# Patient Record
Sex: Female | Born: 1935 | Race: White | Hispanic: No
Health system: Southern US, Community
[De-identification: ages and names within clinical notes are randomized; demographics above are authoritative.]

## PROBLEM LIST (undated history)

## (undated) DIAGNOSIS — F0390 Unspecified dementia without behavioral disturbance: Secondary | ICD-10-CM

## (undated) DIAGNOSIS — I4891 Unspecified atrial fibrillation: Secondary | ICD-10-CM

---

## 2004-06-24 ENCOUNTER — Ambulatory Visit: Payer: Self-pay | Admitting: Internal Medicine

## 2004-12-22 ENCOUNTER — Ambulatory Visit: Payer: Self-pay | Admitting: Internal Medicine

## 2004-12-28 ENCOUNTER — Ambulatory Visit: Payer: Self-pay | Admitting: Internal Medicine

## 2005-12-25 ENCOUNTER — Ambulatory Visit: Payer: Self-pay | Admitting: Internal Medicine

## 2007-05-30 ENCOUNTER — Ambulatory Visit: Payer: Self-pay | Admitting: Family Medicine

## 2008-06-01 ENCOUNTER — Ambulatory Visit: Payer: Self-pay | Admitting: Internal Medicine

## 2010-07-26 ENCOUNTER — Ambulatory Visit: Payer: Self-pay | Admitting: Internal Medicine

## 2010-11-24 ENCOUNTER — Ambulatory Visit: Payer: Self-pay | Admitting: Physical Medicine and Rehabilitation

## 2014-05-05 ENCOUNTER — Inpatient Hospital Stay: Payer: Self-pay | Admitting: Internal Medicine

## 2014-05-05 LAB — COMPREHENSIVE METABOLIC PANEL
ALBUMIN: 3.5 g/dL (ref 3.4–5.0)
ALT: 18 U/L
AST: 20 U/L (ref 15–37)
Alkaline Phosphatase: 87 U/L
Anion Gap: 5 — ABNORMAL LOW (ref 7–16)
BUN: 25 mg/dL — AB (ref 7–18)
Bilirubin,Total: 0.4 mg/dL (ref 0.2–1.0)
Calcium, Total: 9.1 mg/dL (ref 8.5–10.1)
Chloride: 109 mmol/L — ABNORMAL HIGH (ref 98–107)
Co2: 29 mmol/L (ref 21–32)
Creatinine: 1.26 mg/dL (ref 0.60–1.30)
EGFR (African American): 51 — ABNORMAL LOW
EGFR (Non-African Amer.): 42 — ABNORMAL LOW
GLUCOSE: 167 mg/dL — AB (ref 65–99)
OSMOLALITY: 293 (ref 275–301)
POTASSIUM: 4.5 mmol/L (ref 3.5–5.1)
SODIUM: 143 mmol/L (ref 136–145)
Total Protein: 6.7 g/dL (ref 6.4–8.2)

## 2014-05-05 LAB — CBC
HCT: 39.3 % (ref 35.0–47.0)
HGB: 12.9 g/dL (ref 12.0–16.0)
MCH: 32.1 pg (ref 26.0–34.0)
MCHC: 32.9 g/dL (ref 32.0–36.0)
MCV: 98 fL (ref 80–100)
PLATELETS: 180 10*3/uL (ref 150–440)
RBC: 4.02 10*6/uL (ref 3.80–5.20)
RDW: 14.7 % — ABNORMAL HIGH (ref 11.5–14.5)
WBC: 9.1 10*3/uL (ref 3.6–11.0)

## 2014-05-05 LAB — APTT: Activated PTT: 23.9 secs (ref 23.6–35.9)

## 2014-05-05 LAB — PROTIME-INR
INR: 1.1
PROTHROMBIN TIME: 14 s (ref 11.5–14.7)

## 2014-05-05 LAB — MAGNESIUM: Magnesium: 1.9 mg/dL

## 2014-05-06 LAB — BASIC METABOLIC PANEL
ANION GAP: 6 — AB (ref 7–16)
BUN: 23 mg/dL — AB (ref 7–18)
CALCIUM: 8.4 mg/dL — AB (ref 8.5–10.1)
Chloride: 110 mmol/L — ABNORMAL HIGH (ref 98–107)
Co2: 27 mmol/L (ref 21–32)
Creatinine: 1.14 mg/dL (ref 0.60–1.30)
EGFR (African American): 58 — ABNORMAL LOW
EGFR (Non-African Amer.): 48 — ABNORMAL LOW
GLUCOSE: 121 mg/dL — AB (ref 65–99)
Osmolality: 290 (ref 275–301)
Potassium: 4.3 mmol/L (ref 3.5–5.1)
SODIUM: 143 mmol/L (ref 136–145)

## 2014-05-06 LAB — CBC WITH DIFFERENTIAL/PLATELET
BASOS ABS: 0.1 10*3/uL (ref 0.0–0.1)
BASOS PCT: 0.6 %
EOS ABS: 0 10*3/uL (ref 0.0–0.7)
Eosinophil %: 0.1 %
HCT: 30.7 % — AB (ref 35.0–47.0)
HGB: 10.1 g/dL — ABNORMAL LOW (ref 12.0–16.0)
LYMPHS ABS: 1.1 10*3/uL (ref 1.0–3.6)
Lymphocyte %: 11.1 %
MCH: 32.1 pg (ref 26.0–34.0)
MCHC: 33 g/dL (ref 32.0–36.0)
MCV: 97 fL (ref 80–100)
Monocyte #: 0.9 x10 3/mm (ref 0.2–0.9)
Monocyte %: 8.8 %
NEUTROS PCT: 79.4 %
Neutrophil #: 7.8 10*3/uL — ABNORMAL HIGH (ref 1.4–6.5)
Platelet: 151 10*3/uL (ref 150–440)
RBC: 3.16 10*6/uL — ABNORMAL LOW (ref 3.80–5.20)
RDW: 14.1 % (ref 11.5–14.5)
WBC: 9.8 10*3/uL (ref 3.6–11.0)

## 2014-05-06 LAB — TSH: Thyroid Stimulating Horm: 3.11 u[IU]/mL

## 2014-05-06 LAB — URINALYSIS, COMPLETE
Bilirubin,UR: NEGATIVE
Glucose,UR: NEGATIVE mg/dL (ref 0–75)
NITRITE: NEGATIVE
PH: 5 (ref 4.5–8.0)
Protein: NEGATIVE
RBC,UR: 46 /HPF (ref 0–5)
Specific Gravity: 1.017 (ref 1.003–1.030)
Squamous Epithelial: <1
WBC UR: 21 /HPF (ref 0–5)

## 2014-05-06 LAB — HEMOGLOBIN A1C: Hemoglobin A1C: 5.4 % (ref 4.2–6.3)

## 2014-05-07 LAB — CBC WITH DIFFERENTIAL/PLATELET
BASOS PCT: 0.7 %
Basophil #: 0.1 10*3/uL (ref 0.0–0.1)
EOS ABS: 0 10*3/uL (ref 0.0–0.7)
Eosinophil %: 0.3 %
HCT: 25.1 % — ABNORMAL LOW (ref 35.0–47.0)
HGB: 8.3 g/dL — ABNORMAL LOW (ref 12.0–16.0)
Lymphocyte #: 1.1 10*3/uL (ref 1.0–3.6)
Lymphocyte %: 12.5 %
MCH: 32.3 pg (ref 26.0–34.0)
MCHC: 33 g/dL (ref 32.0–36.0)
MCV: 98 fL (ref 80–100)
MONO ABS: 1 x10 3/mm — AB (ref 0.2–0.9)
Monocyte %: 11.5 %
NEUTROS ABS: 6.3 10*3/uL (ref 1.4–6.5)
Neutrophil %: 75 %
Platelet: 122 10*3/uL — ABNORMAL LOW (ref 150–440)
RBC: 2.57 10*6/uL — AB (ref 3.80–5.20)
RDW: 14 % (ref 11.5–14.5)
WBC: 8.5 10*3/uL (ref 3.6–11.0)

## 2014-05-07 LAB — BASIC METABOLIC PANEL
ANION GAP: 5 — AB (ref 7–16)
BUN: 26 mg/dL — AB (ref 7–18)
CHLORIDE: 113 mmol/L — AB (ref 98–107)
CO2: 27 mmol/L (ref 21–32)
Calcium, Total: 8 mg/dL — ABNORMAL LOW (ref 8.5–10.1)
Creatinine: 1.19 mg/dL (ref 0.60–1.30)
EGFR (African American): 55 — ABNORMAL LOW
EGFR (Non-African Amer.): 45 — ABNORMAL LOW
Glucose: 118 mg/dL — ABNORMAL HIGH (ref 65–99)
Osmolality: 295 (ref 275–301)
POTASSIUM: 4.1 mmol/L (ref 3.5–5.1)
SODIUM: 145 mmol/L (ref 136–145)

## 2014-05-07 LAB — HEMOGLOBIN: HGB: 8.6 g/dL — ABNORMAL LOW (ref 12.0–16.0)

## 2014-05-08 LAB — HEMOGLOBIN
HGB: 7.3 g/dL — ABNORMAL LOW (ref 12.0–16.0)
HGB: 8.9 g/dL — ABNORMAL LOW (ref 12.0–16.0)

## 2014-05-09 LAB — HEMOGLOBIN: HGB: 8.9 g/dL — AB (ref 12.0–16.0)

## 2014-10-10 NOTE — Op Note (Signed)
PATIENT NAMElmer Sow:  Navarrette, Solash P MR#:  [id number] DATE OF BIRTH:  [birth year]  DATE OF PROCEDURE:  05/06/2014  PREOPERATIVE DIAGNOSIS: Unstable right intertrochanteric hip fracture.   POSTOPERATIVE DIAGNOSIS: Unstable right intertrochanteric hip fracture.  PROCEDURE: Open reduction and internal fixation, right intertrochanteric hip fracture.   SURGEON: Kennedy BuckerMichael Liron Eissler, M.D.   ANESTHESIA: Spinal.   DESCRIPTION OF PROCEDURE: The patient was brought to the operating room and after adequate spinal anesthesia was obtained, the patient was placed on the operative table with the left leg in the well leg holder, right foot in the foot traction boot with slight traction applied. C-arm was brought in and good visualization of the proximal femur was obtained with essentially anatomic alignment of the fracture. After prepping and draping in the usual sterile fashion, appropriate patient identification and timeout procedures were completed. A small incision was made proximally, and a guidewire was inserted into the tip of the trochanter with soft tissue protector placed around this and proximal reaming was carried out. A guidewire was inserted down the canal. Next, reaming was carried out to 11 mm, at which point there was chatter. Measurements were made of the wire in a 9 x 360 mm right Affixus nail was inserted down the canal. The guidewire was removed. Using the lateral targeting device on the 130 degrees guide, a small lateral incision was made and a guidewire was inserted up into the center of the femoral head. This was measured, drilled, and then a 100 mm lag screw was inserted with traction released and compression applied after setting the screw so it would not back out. The insertion handle was removed and permanent AP and lateral images were obtained. The distal interlocking screw was then placed after getting good visualization of the distal rod. A small stab incision was made, drilling, and placing a 42 mm  5.0 cortical screw. Permanent AP view was obtained showing the screw crossing the femur. At this point, the wounds were thoroughly irrigated. The deep fascia was closed using an 0 Vicryl, 2-0 Vicryl  subcutaneously, and skin staples. Xeroform, 4 x 4's, ABD, and tape applied. The patient was sent recovery room in stable condition.   ESTIMATED BLOOD LOSS: 200 mL.   COMPLICATIONS: None.   SPECIMEN: None.   IMPLANTS: Biomet right long Affixus 9 x 360 mm 130 degrees with 100 mm lag screw, 42 mm  distal interlocking screw.    ____________________________ Leitha SchullerMichael J. Jearldean Gutt, MD mjm:bm D: 05/06/2014 19:37:43 ET T: 05/06/2014 23:37:03 ET JOB#: [id number]  cc: Leitha SchullerMichael J. Emmert Roethler, MD, <Dictator> Leitha SchullerMICHAEL J Cerria Randhawa MD ELECTRONICALLY SIGNED 05/07/2014 7:23

## 2014-10-10 NOTE — H&P (Signed)
PATIENT NAMElmer Rojas:  Rojas, Brooke Rojas MR#:  [id number] DATE OF BIRTH:  [birth year]  DATE OF ADMISSION:  05/05/2014  PRIMARY CARE PHYSICIAN:  Dr. Harrington Challengerhies.   CHIEF COMPLAINT: Hip pain.   HISTORY OF PRESENT ILLNESS: This is a [age] year old female with dementia who was out in the yard today and had a fall. Someone passing by saw her in the yard and got help, nobody witnessed the fall. The patient is unable to give me any details about what happened with the fall. Pain is severe in the right hip. The patient just received IV pain medications ordered by the ER physician. The patient feels okay. No complaints of chest pain or shortness of breath. In the ER was found to have a right intertrochanteric femur fracture, also right superior and inferior pubic rami fractures indeterminate acuity. Hospitalist services were contacted for further evaluation. ER physician Dr. Scotty CourtStafford contacted Dr. Rosita KeaMenz from the Emergency Room.   PAST MEDICAL HISTORY: Alzheimer dementia.   PAST SURGICAL HISTORY: None.    ALLERGIES: No known drug allergies.   MEDICATIONS: Include alendronate 70 mg once a week on Fridays, donepezil which is Aricept 10 mg at bedtime, Namenda 10 mg twice a day, vitamin D3, 1000 international units daily.   SOCIAL HISTORY: Lives with family. No smoking. No alcohol. No drug use. Used to work in a [employer] and [employer].   FAMILY HISTORY: Father died with Parkinson's. Mother died of a stroke.   REVIEW OF SYSTEMS:  CONSTITUTIONAL: No fever, chills, or sweats. No weight loss. No weight gain. No weakness or fatigue.  EYES: She does wear glasses.  EARS, NOSE, MOUTH, AND THROAT:  No hearing loss. No sore throat. No difficulty swallowing.  CARDIOVASCULAR: No chest pain. No palpitations.  RESPIRATORY: No shortness of breath, no cough, no sputum. No hemoptysis.  GASTROINTESTINAL: No nausea. No vomiting. No abdominal pain. No diarrhea. No constipation. No bright red blood per rectum. No melena.   GENITOURINARY: No burning on urination. No hematuria.  MUSCULOSKELETAL: Positive for right hip pain.  INTEGUMENT: No rashes or eruptions.  NEUROLOGIC: No fainting or blackouts.  PSYCHIATRIC: On medication for depression.  ENDOCRINE: No thyroid problems.  HEMATOLOGIC AND LYMPHATIC: No anemia. No easy bruising or bleeding.   PHYSICAL EXAMINATION:  VITAL SIGNS: Temperature 98, pulse 86, respirations 20, blood pressure 156/91, pulse oximetry 98% on room air.  GENERAL: No respiratory distress, lying flat in bed.  EYES: Conjunctivae and lids normal. Pupils equal, round, and reactive to light. Extraocular muscles intact. No nystagmus.  EARS, NOSE, MOUTH, AND THROAT: Tympanic membranes, no erythema. Nasal mucosa, no erythema. Throat, no erythema, no exudate seen. Lips and gums, no lesions.  NECK: No JVD. No bruits. No lymphadenopathy. No thyromegaly. No thyroid nodules palpated.  RESPIRATORY:  Lungs clear to auscultation. No use of accessory muscles to breathe. No rhonchi, rales, or wheeze heard.  CARDIOVASCULAR: S1, S2, irregularly irregular, rate controlled, 4 out of 6 systolic ejection murmur. Dorsalis pedis pulses 2 + bilaterally. Trace edema. Carotid upstroke 2 + bilaterally  ABDOMEN: Soft, nontender. No organomegaly/splenomegaly. Normoactive bowel sounds. No masses felt.  LYMPHATIC: No lymph nodes in the neck.  MUSCULOSKELETAL: Right leg shortened and externally rotated. 1 + edema. No clubbing. No cyanosis.  SKIN: No ulcers or lesions seen.  NEUROLOGICAL: Cranial nerves II through XII grossly intact. Sensation intact to bilateral lower extremities to light touch.  PSYCHIATRIC: The patient is alert to person and place.   LABORATORY AND RADIOLOGICAL DATA: EKG shows atrial fibrillation,  88 beats per minute. Chest x-ray negative. Right hip shows right superior and inferior pubic rami fractures indeterminate acuity, right intertrochanteric femur fracture. CT scan of the head and cervical spine  shows a superior endplate compression fracture of T2 vertebral body without acute fracture planes which favor old fracture, brain shows atrophy, small vessel chronic ischemic changes deep cerebral white matter. White blood cell count 9.1, H and H 12.9 and 39.3, platelet count 180,000. Glucose 167, BUN 25, creatinine 1.26, sodium 143, potassium 4.5, chloride 109, CO2 of 29, calcium 9.1. Liver function tests normal range. PT, INR normal range.   ASSESSMENT AND PLAN:  1.  Hip fracture on the right, closed, preoperative evaluation. I see no contraindications to surgery at this time. No further cardiac testing needed prior to surgery. The patient does have a high risk of mortality within the first year of a hip fracture, especially if surgery not done with risks of blood clots, skin breakdown, and pneumonia if you are just lying around in bed.  Surgery must be done in order to try to walk again, Family and patient willing to undergo the risks of surgery in order to walk again as quickly as possible.  2.  Atrial fibrillation, unclear if new or old. Again you do not have to get cardiac testing prior to surgery, but I will monitor off unit telemetry, echocardiogram, and start low-dose metoprolol, anticoagulation consideration after surgery, likely with dementia aspirin would be the likely choice.  3.  Dementia without behavioral disturbance, patient on Aricept and Namenda.  4.  Renal insufficiency, unclear if this is acute renal failure versus chronic kidney disease stage III. We will give gentle fluids and monitor creatinine again in the morning.  5.  Impaired fasting glucose. Check a hemoglobin A1c, put on sliding scale at this point.    TIME SPENT ON ADMISSION: 55 minutes.   CODE STATUS:  The patient is a DNR.    ____________________________ Herschell Dimes. Renae Gloss, MD rjw:bu D: 05/05/2014 20:40:38 ET T: 05/05/2014 21:12:17 ET JOB#: [id number]  cc: Herschell Dimes. Renae Gloss, MD, <Dictator> Neomia Dear. Harrington Challenger,  MD Salley Scarlet MD ELECTRONICALLY SIGNED 05/08/2014 16:31

## 2014-10-10 NOTE — Discharge Summary (Signed)
PATIENT NAME:  Brooke Rojas, [name] MR#:  [id number] DATE OF BIRTH:  05/29/1922  DATE OF ADMISSION:  05/05/2014 DATE OF DISCHARGE:  05/09/2014  ADDENDUM:   ADMITTING PHYSICIAN: Alford Highlandichard Wieting, DO.  DISCHARGING PHYSICIAN: Enid Baasadhika Sampson Self, MD.   PRIMARY CARE PHYSICIAN: Neomia Dearavid N. Harrington Challengerhies, MD.  CONSULTATIONS IN THE HOSPITAL: Orthopedic consultation by Dr. Kennedy BuckerMichael Menz.   For whole discharge summary, discharge medications and hospital course, please look at the discharge summary dictated by Dr. Enedina FinnerSona Patel on 05/09/2014. In brief, the patient is a 81 year old elderly female with a history of dementia, admitted after a fall and had sustained a right hip fracture. She had open reduction and internal fixation done by Dr. Rosita KeaMenz in the hospital on 05/06/2014. Postoperatively, the patient has done very well. Worked with physical therapy. Pain better controlled and is being discharged [facility]. Her course has been otherwise uneventful.   DISCHARGE CONDITION: Stable.   DISCHARGE DISPOSITION: To [facility].   CODE STATUS: Do not resuscitate.   TIME SPENT ON DISCHARGE: 40 minutes.    ____________________________ Enid Baasadhika Damarien Nyman, MD rk:TT D: 05/09/2014 13:54:47 ET T: 05/09/2014 19:47:53 ET JOB#: [id number]  cc: Enid Baasadhika Evart Mcdonnell, MD, <Dictator> Neomia Dearavid N. Harrington Challengerhies, MD Enid BaasADHIKA Torin Whisner MD ELECTRONICALLY SIGNED 06/03/2014 14:47

## 2014-10-10 NOTE — Discharge Summary (Signed)
PATIENT NAMElmer Rojas:  Brooke Rojas, Brooke Rojas MR#:  [id number] DATE OF BIRTH:  [birth year]  DATE OF ADMISSION:  05/05/2014 DATE OF DISCHARGE:  05/09/2014  PRESENTING COMPLAINT: Fall.   DISCHARGE DIAGNOSES: 1.  Right hip fracture status post mechanical fall.  2.  Dementia.  3.  Postoperative anemia status post blood transfusion.   PROCEDURES: Right femur intertrochanteric fracture status post nailing/pinning by Dr. Rosita KeaMenz.   CODE STATUS: No code, DNR.   DISCHARGE MEDICATIONS: 1.  Vitamin D3 1,000 international units Rojas.o. daily.  2.  Alendronate 70 mg Rojas.o. q. weekly.  3.  Donepezil 10 mg at bedtime.  4.  Memantine 10 mg b.i.d.  5.  Lovenox 40 mg subcutaneous daily for 14 days and then begin aspirin 81 mg daily.  6.  Acetaminophen/hydrocodone 325/ one tablet every 6 hours as needed.   DISCHARGE INSTRUCTIONS: 1.  Physical therapy.  2.  Follow up with Dr. Harrington Challengerhies, primary care physician, in 2 to 4 weeks.  3.  Follow up with Dr. Rosita KeaMenz per instructions.  DIAGNOSTIC DATA: White count 8.5, H and H 8.3 and 25.1. Creatinine 1.19, sodium 145, potassium 4.1, chloride 113.  Right hip: Internal fixation of intertrochanteric fracture of the proximal right femur.  Echo Doppler of the heart: EF 70% to 75%, moderately dilated left atrium. Moderately dilated right atrium. Moderate to severe mitral valve regurgitation. Mild aortic regurgitation. Moderate to severe tricuspid regurgitation.   White count is 9.8. Hemoglobin A1c is 5.4.   BRIEF SUMMARY OF HOSPITAL COURSE: [name] is a 81 year old Caucasian female with past medical history of dementia, comes in with:  1.  Hip fracture. The patient had a mechanical fall. She is postop day 2, underwent surgery by Dr. Rosita KeaMenz. Postop she is doing well, tolerating physical therapy well. She will be discharged to rehab in a.m. 2.  Arrhythmia, tachycardia versus atrial fibrillation. Unclear if new or old. The patient's heart rate is stable. Will continue aspirin, as far as  anticoagulation. Given her dementia and fall would be a good choice.  3.  Dementia without behavioral disturbance. The patient on Aricept and Namenda.  4.  Renal insufficiency. The patient received IV fluids, stable.  5.  Postop anemia, status post 1 unit of blood transfusion.  If remains stable will [facility]. The patient is a no code, DNR.   TIME SPENT: 40 minutes.   ____________________________ Wylie HailSona A. Allena KatzPatel, MD sap:sb D: 05/08/2014 14:07:00 ET T: 05/08/2014 16:28:46 ET JOB#: [id number]  cc: Thanh Mottern A. Allena KatzPatel, MD, <Dictator> Neomia Dearavid N. Harrington Challengerhies, MD Leitha SchullerMichael J. Menz, MD Willow OraSONA A Mekiah Wahler MD ELECTRONICALLY SIGNED 05/21/2014 14:07

## 2014-10-10 NOTE — Consult Note (Signed)
Brief Consult Note: Diagnosis: right intertrochanteric hip fracture.   Patient was seen by consultant.   Orders entered.   Comments: ORIF right hip with IM rod when OR schedules allows.  Electronic Signatures: Leitha SchullerMenz, Dayzee Trower J (MD)  (Signed [id number]-Nov-15 23:12)  Authored: Brief Consult Note   Last Updated: 17-Nov-15 23:12 by Leitha SchullerMenz, Navy Rothschild J (MD)

## 2016-03-18 ENCOUNTER — Emergency Department

## 2016-03-18 ENCOUNTER — Emergency Department
Admission: EM | Admit: 2016-03-18 | Discharge: 2016-03-18 | Disposition: A | Discharge status: Home / Self Care | Attending: Student | Admitting: Student

## 2016-03-18 DIAGNOSIS — S40211A Abrasion of right shoulder, initial encounter: Secondary | ICD-10-CM | POA: Diagnosis not present

## 2016-03-18 DIAGNOSIS — Y999 Unspecified external cause status: Secondary | ICD-10-CM | POA: Insufficient documentation

## 2016-03-18 DIAGNOSIS — W19XXXA Unspecified fall, initial encounter: Secondary | ICD-10-CM | POA: Insufficient documentation

## 2016-03-18 DIAGNOSIS — S06300A Unspecified focal traumatic brain injury without loss of consciousness, initial encounter: Secondary | ICD-10-CM | POA: Insufficient documentation

## 2016-03-18 DIAGNOSIS — Y939 Activity, unspecified: Secondary | ICD-10-CM | POA: Insufficient documentation

## 2016-03-18 DIAGNOSIS — S0990XA Unspecified injury of head, initial encounter: Secondary | ICD-10-CM | POA: Diagnosis present

## 2016-03-18 DIAGNOSIS — I629 Nontraumatic intracranial hemorrhage, unspecified: Secondary | ICD-10-CM

## 2016-03-18 DIAGNOSIS — Y929 Unspecified place or not applicable: Secondary | ICD-10-CM | POA: Diagnosis not present

## 2016-03-18 HISTORY — DX: Unspecified atrial fibrillation: I48.91

## 2016-03-18 HISTORY — DX: Unspecified dementia, unspecified severity, without behavioral disturbance, psychotic disturbance, mood disturbance, and anxiety: F03.90

## 2016-03-18 LAB — URINALYSIS COMPLETE WITH MICROSCOPIC (ARMC ONLY)
BILIRUBIN URINE: NEGATIVE
Glucose, UA: NEGATIVE mg/dL
Leukocytes, UA: NEGATIVE
NITRITE: NEGATIVE
PH: 5 (ref 5.0–8.0)
PROTEIN: 30 mg/dL — AB
Specific Gravity, Urine: 1.024 (ref 1.005–1.030)

## 2016-03-18 LAB — CBC WITH DIFFERENTIAL/PLATELET
Basophils Absolute: 0 10*3/uL (ref 0–0.1)
Basophils Relative: 0 %
EOS ABS: 0 10*3/uL (ref 0–0.7)
Eosinophils Relative: 0 %
HEMATOCRIT: 37.1 % (ref 35.0–47.0)
HEMOGLOBIN: 12.6 g/dL (ref 12.0–16.0)
LYMPHS ABS: 0.8 10*3/uL — AB (ref 1.0–3.6)
LYMPHS PCT: 7 %
MCH: 31.8 pg (ref 26.0–34.0)
MCHC: 34 g/dL (ref 32.0–36.0)
MCV: 93.6 fL (ref 80.0–100.0)
MONOS PCT: 5 %
Monocytes Absolute: 0.6 10*3/uL (ref 0.2–0.9)
NEUTROS PCT: 88 %
Neutro Abs: 11 10*3/uL — ABNORMAL HIGH (ref 1.4–6.5)
Platelets: 239 10*3/uL (ref 150–440)
RBC: 3.96 MIL/uL (ref 3.80–5.20)
RDW: 14.1 % (ref 11.5–14.5)
WBC: 12.4 10*3/uL — ABNORMAL HIGH (ref 3.6–11.0)

## 2016-03-18 LAB — COMPREHENSIVE METABOLIC PANEL
ALK PHOS: 115 U/L (ref 38–126)
ALT: 16 U/L (ref 14–54)
ANION GAP: 8 (ref 5–15)
AST: 32 U/L (ref 15–41)
Albumin: 3.5 g/dL (ref 3.5–5.0)
BILIRUBIN TOTAL: 0.8 mg/dL (ref 0.3–1.2)
BUN: 23 mg/dL — ABNORMAL HIGH (ref 6–20)
CALCIUM: 9.4 mg/dL (ref 8.9–10.3)
CO2: 28 mmol/L (ref 22–32)
CREATININE: 0.98 mg/dL (ref 0.44–1.00)
Chloride: 105 mmol/L (ref 101–111)
GFR calc non Af Amer: 49 mL/min — ABNORMAL LOW (ref >60)
GFR, EST AFRICAN AMERICAN: 56 mL/min — AB (ref >60)
Glucose, Bld: 105 mg/dL — ABNORMAL HIGH (ref 65–99)
Potassium: 4 mmol/L (ref 3.5–5.1)
Sodium: 141 mmol/L (ref 135–145)
TOTAL PROTEIN: 6.9 g/dL (ref 6.5–8.1)

## 2016-03-18 LAB — TROPONIN I: Troponin I: <0.03 ng/mL (ref <0.03)

## 2016-03-18 LAB — CK: Total CK: 211 U/L (ref 38–234)

## 2016-03-18 MED ORDER — BACITRACIN ZINC 500 UNIT/GM EX OINT
TOPICAL_OINTMENT | Freq: Once | CUTANEOUS | Status: AC
  Administered 2016-03-18: 1 via TOPICAL

## 2016-03-18 MED ORDER — ACETAMINOPHEN 650 MG RE SUPP
650.0000 mg | Freq: Once | RECTAL | Status: AC
  Administered 2016-03-18: 650 mg via RECTAL
  Filled 2016-03-18: qty 1

## 2016-03-18 MED ORDER — BACITRACIN ZINC 500 UNIT/GM EX OINT
TOPICAL_OINTMENT | CUTANEOUS | Status: AC
  Administered 2016-03-18: 1 via TOPICAL
  Filled 2016-03-18: qty 0.9

## 2016-03-18 NOTE — ED Notes (Signed)
Pt repositioned

## 2016-03-18 NOTE — ED Notes (Signed)
Attempted to call [facility] x2 to let them know pt is returning EMS. No answer.

## 2016-03-18 NOTE — ED Triage Notes (Addendum)
Pt came to ED via EMS from [facility]. Pt had unwitnessed fall, staff reports they believe she rolled out of bed around 430. Pt has multiple hematomas to right arm and avulsion to right side of forehead.

## 2016-03-18 NOTE — ED Notes (Signed)
Attempted to feed pt. Pt will not eat.

## 2016-03-18 NOTE — Progress Notes (Signed)
Patient family was consulted and plan of care was discussed as follows. [facility] to [facility] and [facility].   LCSW called [facility] to inquire if this patient had Hospice in place. Spoke to Nash-Finch CompanyLynn Press photographercharge nurse at [facility] and was provided the phone number of [facility] [phone] and spoke to LimaAnita. It was explained that the patient has already in home Hospice services during the week and Synetta Failnita agreed to send a nurse on Sunday. Patient family was consulted and plan of care was discussed and accepted by the family ( patient's sister and grand niece)  ED nurse attempted to feed patient and will dress patients head injury and apply pain management.  LCSW consulted with EDP and  Care Manager and EDP Secretary Christen Bameonnie he will arrange EMS transport for this patient.  No further needs  Delta Air LinesClaudine Eulalio Reamy LCSW [phone]

## 2016-03-18 NOTE — ED Notes (Signed)
\  [id number]\

## 2016-03-18 NOTE — ED Notes (Signed)
Changed pt  and cleaned her. Put new brief on.

## 2016-03-18 NOTE — ED Provider Notes (Addendum)
[facility] Emergency Department Provider Note   ____________________________________________   First MD Initiated Contact with Patient 03/18/16 [phone]     (approximate)  I have reviewed the triage vital signs and the nursing notes.   HISTORY  Chief Complaint Fall  Caveat-history of present illness and review of systems Limited due to severe dementia, all information is obtained from EMS as well as staff at [facility].  HPI Brooke Rojas is a 82 y.o. female with atrial fibrillation on baby aspirin, end-stage dementia, failure to thrive, and DO NOT RESUSCITATE on hospicewho presents for evaluation of an unwitnessed fall. According to staff, she was found on the floor by her  bed at approximately 4:30 this morning when staff was making rounds. She was noted to have an abrasion to the forehead as well as the right shoulder and elbow. They put her back in bed. For reasons which are not clear to me, she was not sent to emergency department at that time however she is being sent for evaluation much later though there was no change in her mental status, no vomiting. She has otherwise been in her usual state of health without illness according to staff, no vomiting, diarrhea, fevers or chills.   Past Medical History:  Diagnosis Date  . A-fib (HCC)   . Dementia     There are no active problems to display for this patient.   History reviewed. No pertinent surgical history.  Prior to Admission medications   Not on File    Allergies Review of patient's allergies indicates no known allergies.  No family history on file.  Social History Social History  Substance Use Topics  . Smoking status: Unknown If Ever Smoked  . Smokeless tobacco: Never Used  . Alcohol use No    Review of Systems  Caveat-history of present illness and review of systems Limited due to severe dementia, all information is obtained from EMS as well as staff at  [facility] [place]. ____________________________________________   PHYSICAL EXAM:  Vitals:   03/18/16 1230 03/18/16 1532 03/18/16 1556 03/18/16 1603  BP: 107/66 (!) 235/191 (!) 85/59 (!) 113/51  Pulse: 81 (!) 105 98 96  Resp: 15 16 14 14   Temp:      TempSrc:      SpO2: 96% 99% 100% 98%    VITAL SIGNS: ED Triage Vitals  Enc Vitals Group     BP 03/18/16 1022 140/84     Pulse Rate 03/18/16 1022 78     Resp 03/18/16 1022 18     Temp 03/18/16 1038 98.3 F (36.8 C)     Temp Source 03/18/16 1038 Rectal     SpO2 03/18/16 1022 100 %     Weight --      Height --      Head Circumference --      Peak Flow --      Pain Score --      Pain Loc --      Pain Edu? --      Excl. in GC? --     Constitutional: Awake and alert but does not verbalize, does not follow commands, chronically ill-appearing, cachectic. Eyes: Conjunctivae are normal. PERRL. EOMI. Head: Abrasion to the right frontal scalp. Nose: No congestion/rhinnorhea. Mouth/Throat: Mucous membranes are moist.  Oropharynx non-erythematous. Neck: No stridor.  Appears supple without meningismus. No midline C-spine tenderness to palpation. Cardiovascular: Normal rate, irregular rhythm. Grossly normal heart sounds.  Good peripheral circulation. Respiratory: Normal  respiratory effort.  No retractions. Lungs CTAB. Gastrointestinal: Soft and nontender. No distention.  No CVA tenderness. Genitourinary: Deferred Musculoskeletal: No lower extremity tenderness nor edema.  No joint effusions. Multiple areas of healing ecchymosis throughout the legs bilaterally, abrasion to the right shoulder and the right elbow all however the patient has good range of motion, there is no associated bony abnormality. Neurologic:  Patient does not verbalize but this is her baseline. She does not follow commands though she does have spontaneous movement of the extremities. Her arms and legs are chronically contracted. Skin:  Skin is warm, dry and  intact. No rash noted. Psychiatric: Unable to assess.  ____________________________________________   LABS (all labs ordered are listed, but only abnormal results are displayed)  Labs Reviewed  CBC WITH DIFFERENTIAL/PLATELET - Abnormal; Notable for the following:       Result Value   WBC 12.4 (*)    Neutro Abs 11.0 (*)    Lymphs Abs 0.8 (*)    All other components within normal limits  COMPREHENSIVE METABOLIC PANEL - Abnormal; Notable for the following:    Glucose, Bld 105 (*)    BUN 23 (*)    GFR calc non Af Amer 49 (*)    GFR calc Af Amer 56 (*)    All other components within normal limits  URINALYSIS COMPLETEWITH MICROSCOPIC ([facility] ONLY) - Abnormal; Notable for the following:    Color, Urine AMBER (*)    APPearance CLOUDY (*)    Ketones, ur TRACE (*)    Hgb urine dipstick 1+ (*)    Protein, ur 30 (*)    Bacteria, UA RARE (*)    Squamous Epithelial / LPF 0-5 (*)    All other components within normal limits  TROPONIN I  CK   ____________________________________________  EKG  ED ECG REPORT I, Gayla Doss, the attending physician, personally viewed and interpreted this ECG.   Date: 03/18/2016  EKG Time: 10:04  Rate: 90  Rhythm: atrial fibrillation, rate 90  Axis: normal  Intervals:none  ST&T Change: No acute ST elevation or acute ST depression, motion artifact limits the interpretation.  ____________________________________________  RADIOLOGY  CT head and c-spine IMPRESSION:  Within the cranium, there is acute hemorrhage layering in the left  lateral ventricle as described. No midline shift. Chronic changes  are superimposed. Critical Value/emergent results were called by  telephone at the time of interpretation on 03/18/2016 at 11:57 am to  Dr. Cyril Loosen, who verbally acknowledged these results.    No acute cervical spine injury.       CXR IMPRESSION:  No active disease.       ____________________________________________   PROCEDURES  Procedure(s) performed: None  Procedures  Critical Care performed: No  ____________________________________________   INITIAL IMPRESSION / ASSESSMENT AND PLAN / ED COURSE  Pertinent labs & imaging results that were available during my care of the patient were reviewed by me and considered in my medical decision making (see chart for details).  Brooke Rojas is a 82 y.o. female with atrial fibrillation on baby aspirin, end-stage dementia, failure to thrive, and DO NOT RESUSCITATE on hospicewho presents for evaluation of an unwitnessed fall. On exam, she is chronically ill-appearing and in no acute distress. Nonverbal but that is her baseline. She has a small abrasion to the right forehead as well as the right elbow and the right shoulder. Given unwitnessed fall, we'll obtain screening labs, EKG, chest x-ray, urinalysis as well as CT head and C-spine. Reassess for disposition.  -----------------------------------------  12:30 PM on 03/18/2016 ----------------------------------------- CT head concerning for a small amount of hemorrhage in the left lateral ventricle as well as the anterior left temporal horn. I briefly discussed this with the patient's healthcare power of attorney Claris Che who is on her way to discuss treatment options, goals of care, plan given the patient's end-stage dementia. I discussed the case with Dr. Lovell Sheehan of [facility] neurosurgery who reports the patient does not require neurosurgical intervention, nor would she be a candidate for any type of neurosurgical intervention given her underlying comorbidities and DO NOT RESUSCITATE status. Abs reviewed and are notable for mild leukocytosis. Remarkable CMP, urinalysis are consistent with infection, normal CK, negative troponin. Chest x-ray shows no acute cardio pulmonary abnormality.  ----------------------------------------- 3:20 PM on  03/18/2016 ----------------------------------------- Patient's family is at bedside and I discussed the case with them. I discussed the case with the hospitalist, Dr. Judithann Sheen and Dr. Tildon Husky for admission however they do not feel the patient would benefit from admission at this time. The recommendation is to get her into a hospice home tonight or send her back to [facility] with close hospice follow-up. Currently attempting to contact hospice nurse to arrange this.  ----------------------------------------- 4:00 PM on 03/18/2016 ----------------------------------------- Claudine, social worker evaluated the patient and spoke with [facility] as well as patient's hospice provider the patient will return to [place] with hospice care. I discussed with her family at bedside that the patient's prognosis is uncertain and they voiced understanding. DC home.   Clinical Course     ____________________________________________   FINAL CLINICAL IMPRESSION(S) / ED DIAGNOSES  Final diagnoses:  Head injury, initial encounter  Fall, initial encounter  Intracranial hemorrhage (HCC)      NEW MEDICATIONS STARTED DURING THIS VISIT:  New Prescriptions   No medications on file     Note:  This document was prepared using Dragon voice recognition software and may include unintentional dictation errors.    Gayla Doss, MD 03/18/16 0960    Gayla Doss, MD 03/18/16 [phone]

## 2016-03-18 NOTE — ED Notes (Signed)
Called [facility] to report pt will be returning.

## 2016-03-18 NOTE — ED Notes (Signed)
Talked to nurse at [facility] and updated nurse on pts status.

## 2016-03-18 NOTE — ED Notes (Signed)
Pt incontinent of stool. Changed and new brief applied.

## 2016-04-19 DEATH — deceased

## 2018-02-12 IMAGING — CT CT CERVICAL SPINE W/O CM
3 of 12 series · 7 of 33 positions shown, 8 images · non-contrast
Comparison: None.

CLINICAL DATA: Fall

EXAM:
CT HEAD WITHOUT CONTRAST
CT CERVICAL SPINE WITHOUT CONTRAST
TECHNIQUE: Multidetector CT imaging of the head and cervical spine was
performed following the standard protocol without intravenous
contrast. Multiplanar CT image reconstructions of the cervical spine
were also generated.

[Series 2: ax c spine bone · axial · 0.19mm/px · z∈[-286,-157]mm · 3 of 72 slices shown, 4 images]
[im 1/72  soft-tissue]
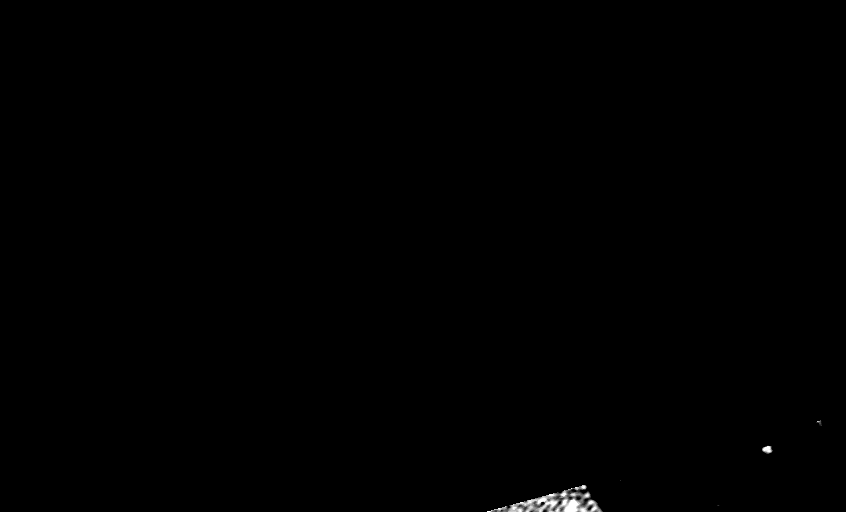
[im 1/72  bone]
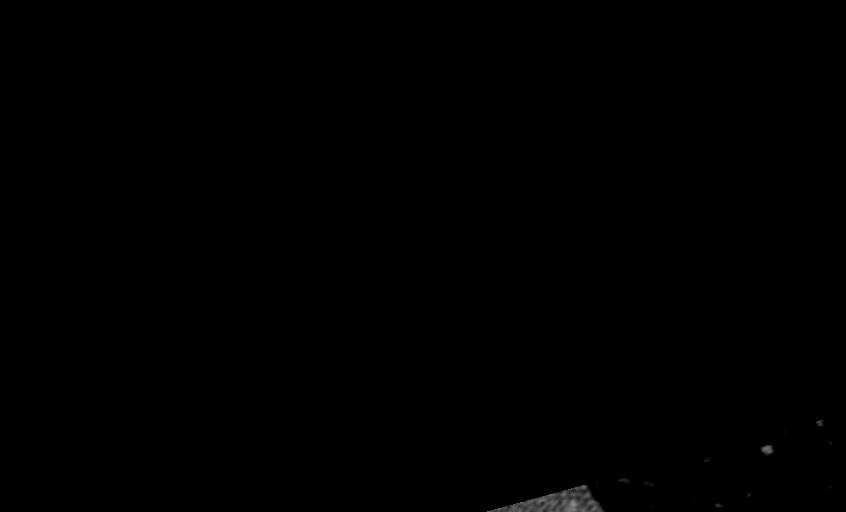
[im 36/72  bone]
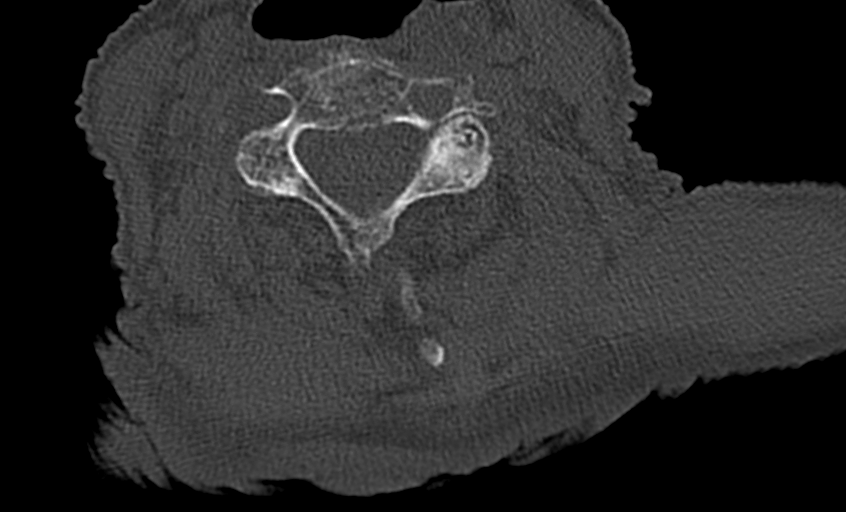
[im 72/72  bone]
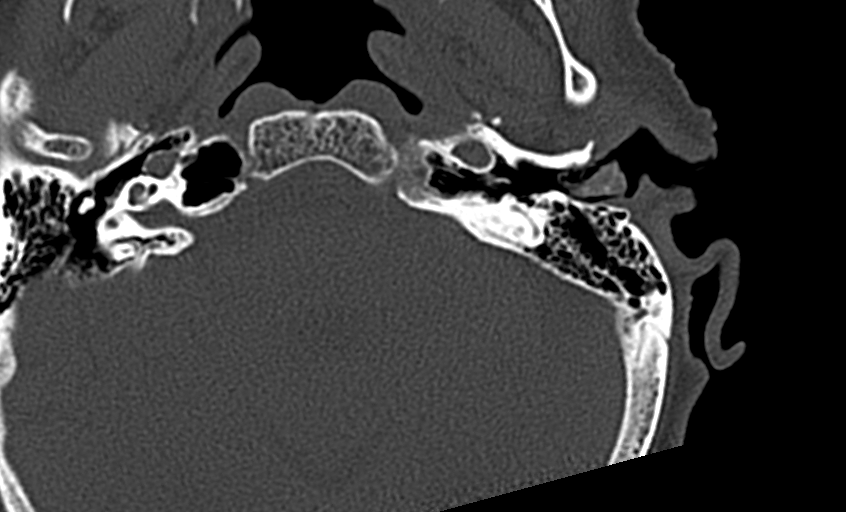

[Series 4: sagittal bone · sagittal · 0.28mm/px · 3 of 61 slices shown]
[im 16/61  bone]
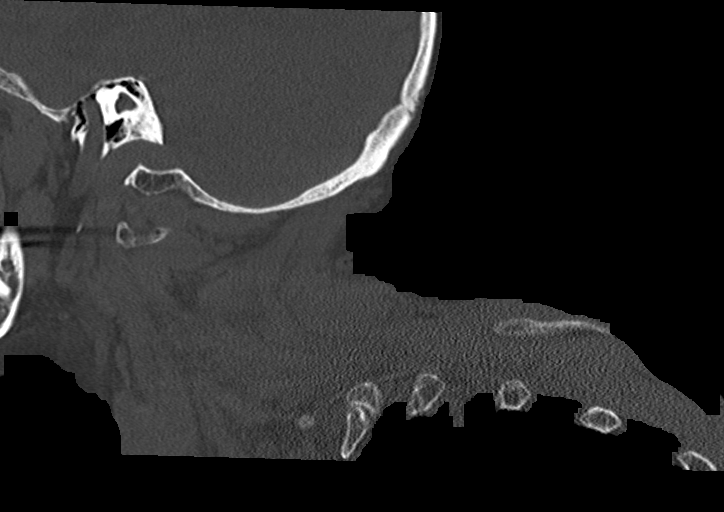
[im 31/61  bone]
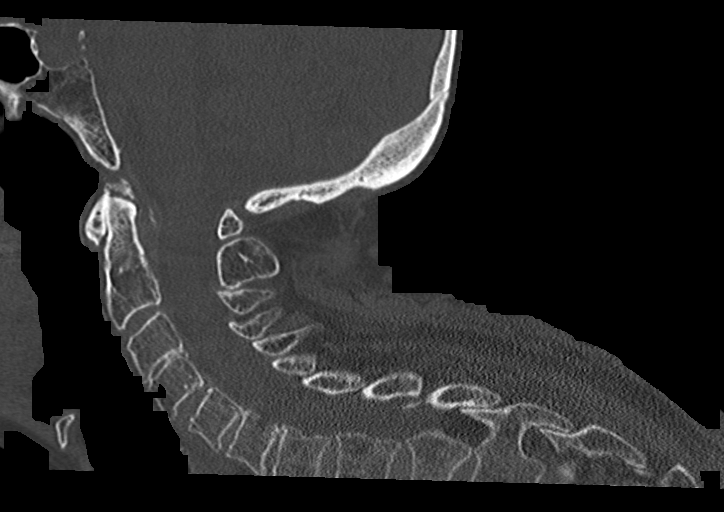
[im 46/61  bone]
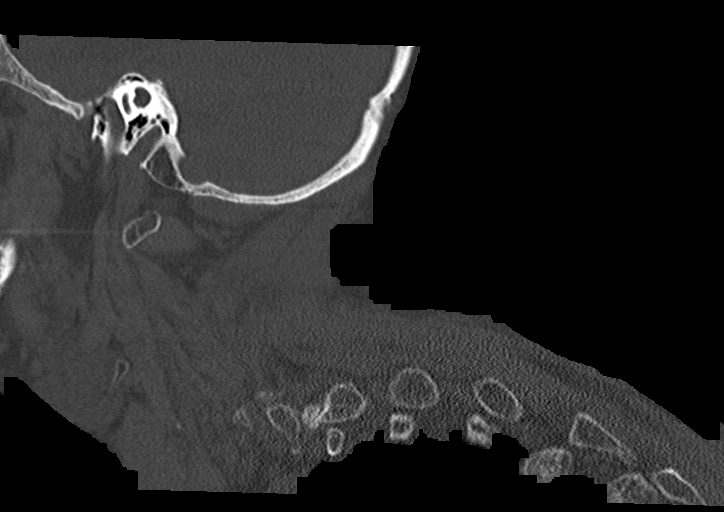

[Series 22: coronal bone · coronal · 0.23mm/px · 1 of 128 slices shown]
[im 64/128  bone]
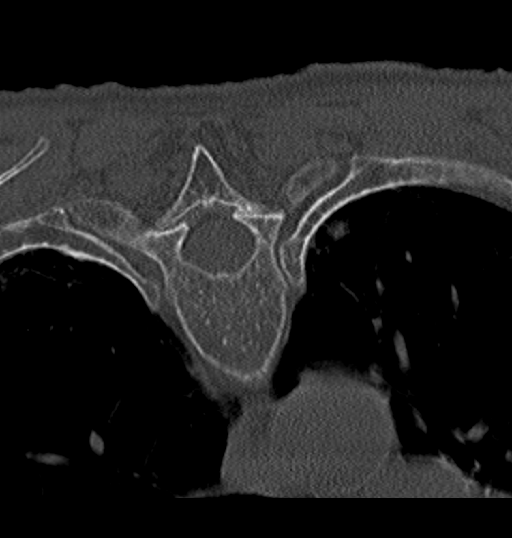

[7 of 33 positions shown; findings below may reference images not displayed]

FINDINGS: CT HEAD FINDINGS

Brain: Global atrophy. Extensive chronic ischemic changes in the
periventricular white matter. There is a small amount of extra-axial
hemorrhage layering in the left lateral ventricle. There is a tiny
hyperdense focus within the anterior left temporal horn which may
represent additional localized hemorrhage. There is no midline
shift. There is no mass effect.

Vascular: Atherosclerotic calcifications of the carotid siphons.

Skull: Cranium is intact.

Sinuses/Orbits: Mastoid air cells clear. Visualized paranasal
sinuses clear.

Other: Soft tissue hematoma over the right temporal bone.

CT CERVICAL SPINE FINDINGS

Alignment: There is marked kyphosis of the lower cervical spine.
Lordosis is preserved in the mid cervical spine.

Skull base and vertebrae: There is no obvious acute fracture or
dislocation in the cervical spine. The T2 compression fracture is
not significantly changed.

Soft tissues and spinal canal: No obvious spinal hematoma or soft
tissue hemorrhage.

Disc levels: No obvious spinal stenosis. Disc space narrowing occurs
C3-4 and C6-7.

Upper chest: Scarring at the lung apices is present.

Other: Carotid atherosclerotic calcifications are noted.
IMPRESSION: Within the cranium, there is acute hemorrhage layering in the left
lateral ventricle as described. No midline shift. Chronic changes
are superimposed. Critical Value/emergent results were called by
telephone at the time of interpretation on 03/18/2016 at [DATE] to
Dr. Geizi, who verbally acknowledged these results.

No acute cervical spine injury.
